# Patient Record
Sex: Female | Born: 1995 | Race: White | Hispanic: No | Marital: Single | Smoking: Never smoker
Health system: Southern US, Community
[De-identification: ages and names within clinical notes are randomized; demographics above are authoritative.]

---
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
## 2016-06-04 ENCOUNTER — Ambulatory Visit (INDEPENDENT_AMBULATORY_CARE_PROVIDER_SITE_OTHER): Payer: 59 | Admitting: Physician Assistant

## 2016-06-04 ENCOUNTER — Encounter: Payer: Self-pay | Admitting: Physician Assistant

## 2016-06-04 VITALS — BP 115/70 | HR 86 | Ht 65.0 in | Wt 119.0 lb

## 2016-06-04 DIAGNOSIS — Z309 Encounter for contraceptive management, unspecified: Secondary | ICD-10-CM

## 2016-06-04 DIAGNOSIS — Z3009 Encounter for other general counseling and advice on contraception: Secondary | ICD-10-CM | POA: Insufficient documentation

## 2016-06-04 DIAGNOSIS — N946 Dysmenorrhea, unspecified: Secondary | ICD-10-CM

## 2016-06-04 MED ORDER — NORETHINDRONE ACET-ETHINYL EST 1-20 MG-MCG PO TABS: 1.0000 | ORAL_TABLET | Freq: Every day | ORAL | 11 refills | Status: DC

## 2016-06-04 NOTE — Patient Instructions (Signed)

## 2016-06-04 NOTE — Progress Notes (Signed)
   Subjective:    Patient ID: Rhonda Huff, female    DOB: 10/27/1996, 20 y.o.   MRN: [id number]  HPI Pt is a 20 yo female who presents to the clinic to establish care.   .. Active Ambulatory Problems    Diagnosis Date Noted  . ANKLE SPRAIN, LEFT 04/02/2010  . ANKLE INJURY, LEFT 04/02/2010  . Birth control counseling 06/04/2016  . Dysmenorrhea 06/04/2016   Resolved Ambulatory Problems    Diagnosis Date Noted  . No Resolved Ambulatory Problems   No Additional Past Medical History   .Marland Kitchen Family History  Problem Relation Age of Onset  . Hypertension Father   . Cancer Maternal Aunt     breast  . Diabetes Maternal Grandmother   . Diabetes Maternal Grandfather    .Marland Kitchen Social History   Social History  . Marital status: Single    Spouse name: N/A  . Number of children: N/A  . Years of education: N/A   Occupational History  . Not on file.   Social History Main Topics  . Smoking status: Never Smoker  . Smokeless tobacco: Never Used  . Alcohol use No  . Drug use: No  . Sexual activity: Not Currently   Other Topics Concern  . Not on file   Social History Narrative  . No narrative on file   Pt would like to start birth control. She has never been on anything. She is not sexually active. She has always had painful periods with 3-4 days of cramping.    Review of Systems  All other systems reviewed and are negative.      Objective:   Physical Exam  Constitutional: She is oriented to person, place, and time. She appears well-developed and well-nourished.  HENT:  Head: Normocephalic and atraumatic.  Cardiovascular: Normal rate, regular rhythm and normal heart sounds.   Pulmonary/Chest: Effort normal and breath sounds normal.  Neurological: She is alert and oriented to person, place, and time.  Psychiatric: She has a normal mood and affect. Her behavior is normal.          Assessment & Plan:  Dysmenorrhea/Birth Control Counseling- started OCP. Discussed side  effects and risk factors. Discussed how to take and what to do if miss a tablet. Pt aware does not protect against STI's and to wear condoms. Follow up in 1 year or as needed.

## 2017-01-31 ENCOUNTER — Other Ambulatory Visit: Payer: Self-pay | Admitting: Physician Assistant

## 2017-02-21 ENCOUNTER — Other Ambulatory Visit: Payer: Self-pay | Admitting: Physician Assistant

## 2017-04-07 ENCOUNTER — Other Ambulatory Visit: Payer: Self-pay | Admitting: Physician Assistant

## 2017-07-22 ENCOUNTER — Ambulatory Visit (INDEPENDENT_AMBULATORY_CARE_PROVIDER_SITE_OTHER): Payer: 59 | Admitting: Physician Assistant

## 2017-07-22 ENCOUNTER — Encounter: Payer: Self-pay | Admitting: Physician Assistant

## 2017-07-22 VITALS — BP 113/70 | HR 70 | Wt 129.0 lb

## 2017-07-22 DIAGNOSIS — N946 Dysmenorrhea, unspecified: Secondary | ICD-10-CM | POA: Diagnosis not present

## 2017-07-22 MED ORDER — NORETHINDRONE ACET-ETHINYL EST 1-20 MG-MCG PO TABS: 1.0000 | ORAL_TABLET | Freq: Every day | ORAL | 4 refills | Status: DC

## 2017-07-22 NOTE — Progress Notes (Signed)
   Subjective:    Patient ID: Rhonda Huff, female    DOB: 02-Nov-1995, 21 y.o.   MRN: [id number]  HPI  Pt is a 21 yo female who presents to the clinic for OCP refill. She takes for dysmenorrhea. She has had a lot of improvement with cramps. She is not sexually active. She denies any side effects.   .. Active Ambulatory Problems    Diagnosis Date Noted  . ANKLE SPRAIN, LEFT 04/02/2010  . ANKLE INJURY, LEFT 04/02/2010  . Birth control counseling 06/04/2016  . Dysmenorrhea 06/04/2016   Resolved Ambulatory Problems    Diagnosis Date Noted  . No Resolved Ambulatory Problems   No Additional Past Medical History      Review of Systems  All other systems reviewed and are negative.      Objective:   Physical Exam  Constitutional: She is oriented to person, place, and time. She appears well-developed and well-nourished.  HENT:  Head: Normocephalic and atraumatic.  Neck: Normal range of motion. Neck supple. No thyromegaly present.  Cardiovascular: Normal rate, regular rhythm and normal heart sounds.   Pulmonary/Chest: Effort normal and breath sounds normal.  Neurological: She is alert and oriented to person, place, and time.  Psychiatric: She has a normal mood and affect. Her behavior is normal.          Assessment & Plan:  Marland KitchenMarland KitchenAyano was seen today for refill on birth control.  Diagnoses and all orders for this visit:  Dysmenorrhea -     norethindrone-ethinyl estradiol (MICROGESTIN) 1-20 MG-MCG tablet; Take 1 tablet by mouth daily.   Refilled for one year.  BP looks great.  Pt is not sexually active but aware OCP does not protect against STD and to also use a condom.  Pt declined flu shot.  Pap not indicated until 22.

## 2017-07-28 ENCOUNTER — Telehealth: Payer: Self-pay | Admitting: Physician Assistant

## 2017-07-28 ENCOUNTER — Ambulatory Visit (INDEPENDENT_AMBULATORY_CARE_PROVIDER_SITE_OTHER): Payer: 59 | Admitting: Physician Assistant

## 2017-07-28 ENCOUNTER — Encounter: Payer: Self-pay | Admitting: Physician Assistant

## 2017-07-28 VITALS — BP 120/68 | HR 78 | Temp 98.0°F | Wt 128.0 lb

## 2017-07-28 DIAGNOSIS — J029 Acute pharyngitis, unspecified: Secondary | ICD-10-CM

## 2017-07-28 DIAGNOSIS — J069 Acute upper respiratory infection, unspecified: Secondary | ICD-10-CM

## 2017-07-28 LAB — POCT RAPID STREP A (OFFICE): Rapid Strep A Screen: NEGATIVE

## 2017-07-28 MED ORDER — BENZONATATE 100 MG PO CAPS: 100.0000 mg | ORAL_CAPSULE | Freq: Three times a day (TID) | ORAL | 0 refills | Status: AC | PRN

## 2017-07-28 MED ORDER — LIDOCAINE VISCOUS 2 % MT SOLN: 10.0000 mL | OROMUCOSAL | 0 refills | Status: DC | PRN

## 2017-07-28 MED ORDER — IPRATROPIUM BROMIDE 0.06 % NA SOLN: 1.0000 | Freq: Four times a day (QID) | NASAL | 0 refills | Status: DC | PRN

## 2017-07-28 NOTE — Telephone Encounter (Signed)
Patient called was just seen today and would like to have a doctor's note faxed to [phone]. Thanks

## 2017-07-28 NOTE — Patient Instructions (Addendum)
- Alternate Tylenol and Advil for throat pain - Advil (Ibuprofen) 600 mg every 6 hours - Tyleno (acetaminophen)l 1000 mg every 8 hours - Gargle and spit viscous lidocaine every 3 hours as needed for throat pain - Cepacol throat lozenges - Warm tea with honey   Upper Respiratory Infection, Adult Most upper respiratory infections (URIs) are a viral infection of the air passages leading to the lungs. A URI affects the nose, throat, and upper air passages. The most common type of URI is nasopharyngitis and is typically referred to as "the common cold." URIs run their course and usually go away on their own. Most of the time, a URI does not require medical attention, but sometimes a bacterial infection in the upper airways can follow a viral infection. This is called a secondary infection. Sinus and middle ear infections are common types of secondary upper respiratory infections. Bacterial pneumonia can also complicate a URI. A URI can worsen asthma and chronic obstructive pulmonary disease (COPD). Sometimes, these complications can require emergency medical care and may be life threatening. What are the causes? Almost all URIs are caused by viruses. A virus is a type of germ and can spread from one person to another. What increases the risk? You may be at risk for a URI if:  You smoke.  You have chronic heart or lung disease.  You have a weakened defense (immune) system.  You are very young or very old.  You have nasal allergies or asthma.  You work in crowded or poorly ventilated areas.  You work in health care facilities or schools.  What are the signs or symptoms? Symptoms typically develop 2-3 days after you come in contact with a cold virus. Most viral URIs last 7-10 days. However, viral URIs from the influenza virus (flu virus) can last 14-18 days and are typically more severe. Symptoms may include:  Runny or stuffy (congested) nose.  Sneezing.  Cough.  Sore  throat.  Headache.  Fatigue.  Fever.  Loss of appetite.  Pain in your forehead, behind your eyes, and over your cheekbones (sinus pain).  Muscle aches.  How is this diagnosed? Your health care provider may diagnose a URI by:  Physical exam.  Tests to check that your symptoms are not due to another condition such as: ? Strep throat. ? Sinusitis. ? Pneumonia. ? Asthma.  How is this treated? A URI goes away on its own with time. It cannot be cured with medicines, but medicines may be prescribed or recommended to relieve symptoms. Medicines may help:  Reduce your fever.  Reduce your cough.  Relieve nasal congestion.  Follow these instructions at home:  Take medicines only as directed by your health care provider.  Gargle warm saltwater or take cough drops to comfort your throat as directed by your health care provider.  Use a warm mist humidifier or inhale steam from a shower to increase air moisture. This may make it easier to breathe.  Drink enough fluid to keep your urine clear or pale yellow.  Eat soups and other clear broths and maintain good nutrition.  Rest as needed.  Return to work when your temperature has returned to normal or as your health care provider advises. You may need to stay home longer to avoid infecting others. You can also use a face mask and careful hand washing to prevent spread of the virus.  Increase the usage of your inhaler if you have asthma.  Do not use any tobacco products, including  cigarettes, chewing tobacco, or electronic cigarettes. If you need help quitting, ask your health care provider. How is this prevented? The best way to protect yourself from getting a cold is to practice good hygiene.  Avoid oral or hand contact with people with cold symptoms.  Wash your hands often if contact occurs.  There is no clear evidence that vitamin C, vitamin E, echinacea, or exercise reduces the chance of developing a cold. However, it is  always recommended to get plenty of rest, exercise, and practice good nutrition. Contact a health care provider if:  You are getting worse rather than better.  Your symptoms are not controlled by medicine.  You have chills.  You have worsening shortness of breath.  You have brown or red mucus.  You have yellow or brown nasal discharge.  You have pain in your face, especially when you bend forward.  You have a fever.  You have swollen neck glands.  You have pain while swallowing.  You have white areas in the back of your throat. Get help right away if:  You have severe or persistent: ? Headache. ? Ear pain. ? Sinus pain. ? Chest pain.  You have chronic lung disease and any of the following: ? Wheezing. ? Prolonged cough. ? Coughing up blood. ? A change in your usual mucus.  You have a stiff neck.  You have changes in your: ? Vision. ? Hearing. ? Thinking. ? Mood. This information is not intended to replace advice given to you by your health care provider. Make sure you discuss any questions you have with your health care provider. Document Released: 04/13/2001 Document Revised: 06/20/2016 Document Reviewed: 01/23/2014 Elsevier Interactive Patient Education  2017 [employer].

## 2017-07-28 NOTE — Telephone Encounter (Signed)
Letter faxed -EH/RMA  

## 2017-07-28 NOTE — Progress Notes (Signed)
HPI:                                                                Rhonda Huff is a 21 y.o. female who presents to [facility]: Primary Care Sports Medicine today for sore throat  Sore Throat   This is a new problem. The current episode started yesterday. The problem has been unchanged. The pain is worse on the left side. There has been no fever. The pain is moderate. Associated symptoms include coughing (productive), ear pain and headaches. Pertinent negatives include no drooling, stridor or trouble swallowing. Associated symptoms comments: + fatigue + myalgias. Treatments tried: antihistamine.     No past medical history on file. No past surgical history on file. Social History  Substance Use Topics  . Smoking status: Never Smoker  . Smokeless tobacco: Never Used  . Alcohol use No   family history includes Cancer in her maternal aunt; Diabetes in her maternal grandfather and maternal grandmother; Hypertension in her father.  ROS: negative except as noted in the HPI  Medications: Current Outpatient Prescriptions  Medication Sig Dispense Refill  . norethindrone-ethinyl estradiol (MICROGESTIN) 1-20 MG-MCG tablet Take 1 tablet by mouth daily. 63 tablet 4   No current facility-administered medications for this visit.    Allergies  Allergen Reactions  . Latex        Objective:  BP 120/68   Pulse 78   Temp 98 F (36.7 C) (Oral)   Wt 128 lb (58.1 kg)   BMI 21.30 kg/m  Gen: well-groomed, cooperative, not ill-appearing, no distress HEENT: normal conjunctiva, TM's clear bilaterally, nasal mucosa edematous, oropharynx with erythema, tonsils are swollen and friable, no exudates, uvula midline, moist mucus membranes, no frontal or maxillary sinus tenderness, neck supple, trachea midline Pulm: Normal work of breathing, normal phonation, clear to auscultation bilaterally, no wheezes, rales or rhonchi CV: Normal rate, regular rhythm, s1 and s2 distinct, no  murmurs, clicks or rubs  Neuro: alert and oriented x 3, no tremor MSK: extremities atraumatic, normal gait and station Lymph: there is tonsillar adenopathy Skin: warm, dry, intact; no rashes on exposed skin, no cyanosis   No results found for this or any previous visit (from the past 72 hour(s)). No results found.  Assessment and Plan: 21 y.o. female with   1. Acute upper respiratory infection - POCT rapid strep A negative - symptomatic management - lidocaine (XYLOCAINE) 2 % solution; Use as directed 10 mLs in the mouth or throat every 3 (three) hours as needed for mouth pain.  Dispense: 100 mL; Refill: 0 - ipratropium (ATROVENT) 0.06 % nasal spray; Place 1 spray into both nostrils 4 (four) times daily as needed.  Dispense: 15 mL; Refill: 0 - benzonatate (TESSALON) 100 MG capsule; Take 1 capsule (100 mg total) by mouth 3 (three) times daily as needed for cough.  Dispense: 40 capsule; Refill: 0   Patient education and anticipatory guidance given Patient agrees with treatment plan Follow-up as needed if symptoms worsen or fail to improve  Levonne Hubert PA-C

## 2017-07-29 ENCOUNTER — Ambulatory Visit: Payer: 59 | Admitting: Physician Assistant

## 2018-06-17 ENCOUNTER — Other Ambulatory Visit: Payer: Self-pay | Admitting: Physician Assistant

## 2018-06-17 DIAGNOSIS — N946 Dysmenorrhea, unspecified: Secondary | ICD-10-CM

## 2018-07-04 ENCOUNTER — Ambulatory Visit (INDEPENDENT_AMBULATORY_CARE_PROVIDER_SITE_OTHER): Payer: 59 | Admitting: Physician Assistant

## 2018-07-04 ENCOUNTER — Encounter: Payer: Self-pay | Admitting: Physician Assistant

## 2018-07-04 DIAGNOSIS — N946 Dysmenorrhea, unspecified: Secondary | ICD-10-CM | POA: Diagnosis not present

## 2018-07-04 MED ORDER — NORETHINDRONE ACET-ETHINYL EST 1-20 MG-MCG PO TABS: 1.0000 | ORAL_TABLET | Freq: Every day | ORAL | 3 refills | Status: DC

## 2018-07-04 NOTE — Progress Notes (Signed)
   Subjective:    Patient ID: Rhonda Huff, female    DOB: Nov 22, 1995, 22 y.o.   MRN: [id number]  HPI  Patient is a 22 year old female who presents to the clinic for refill on birth control.  She is doing great on current regimen.  She is sexually active with one partner.  She is not using any condoms for STD protection.  She has no concerns.  .. Active Ambulatory Problems    Diagnosis Date Noted  . ANKLE SPRAIN, LEFT 04/02/2010  . ANKLE INJURY, LEFT 04/02/2010  . Birth control counseling 06/04/2016  . Dysmenorrhea 06/04/2016   Resolved Ambulatory Problems    Diagnosis Date Noted  . No Resolved Ambulatory Problems   No Additional Past Medical History      Review of Systems  All other systems reviewed and are negative.      Objective:   Physical Exam  Constitutional: She is oriented to person, place, and time. She appears well-developed and well-nourished.  HENT:  Head: Normocephalic and atraumatic.  Cardiovascular: Normal rate and regular rhythm.  Pulmonary/Chest: Effort normal and breath sounds normal.  Neurological: She is alert and oriented to person, place, and time.  Psychiatric: She has a normal mood and affect. Her behavior is normal.          Assessment & Plan:  Marland KitchenMarland KitchenDiagnoses and all orders for this visit:  Dysmenorrhea -     norethindrone-ethinyl estradiol (MICROGESTIN) 1-20 MG-MCG tablet; Take 1 tablet by mouth daily.   Refill birth control for 1 year.  Patient declined any STD testing.  She is aware that Pap smear will be indicated when she turns 22.  Discussed that birth control does not protect you against STDs.  Pt declined flu shot, tdap, HPV vaccine. Pt aware of risks.

## 2019-07-24 ENCOUNTER — Encounter: Payer: Self-pay | Admitting: Physician Assistant

## 2019-07-24 ENCOUNTER — Ambulatory Visit (INDEPENDENT_AMBULATORY_CARE_PROVIDER_SITE_OTHER): Payer: 59 | Admitting: Physician Assistant

## 2019-07-24 ENCOUNTER — Other Ambulatory Visit: Payer: Self-pay

## 2019-07-24 VITALS — BP 125/80 | HR 92 | Ht 65.0 in | Wt 144.0 lb

## 2019-07-24 DIAGNOSIS — F419 Anxiety disorder, unspecified: Secondary | ICD-10-CM | POA: Diagnosis not present

## 2019-07-24 DIAGNOSIS — Z113 Encounter for screening for infections with a predominantly sexual mode of transmission: Secondary | ICD-10-CM | POA: Diagnosis not present

## 2019-07-24 DIAGNOSIS — Z3041 Encounter for surveillance of contraceptive pills: Secondary | ICD-10-CM | POA: Diagnosis not present

## 2019-07-24 MED ORDER — HYDROXYZINE HCL 10 MG PO TABS: 10.0000 mg | ORAL_TABLET | Freq: Three times a day (TID) | ORAL | 1 refills | Status: DC | PRN

## 2019-07-24 MED ORDER — ETONOGESTREL-ETHINYL ESTRADIOL 0.12-0.015 MG/24HR VA RING: VAGINAL_RING | VAGINAL | 12 refills | Status: DC
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
## 2019-07-24 NOTE — Patient Instructions (Signed)
Ethinyl Estradiol; Etonogestrel vaginal ring What is this medicine? ETHINYL ESTRADIOL; ETONOGESTREL (ETH in il es tra DYE ole; et oh noe JES trel) vaginal ring is a flexible, vaginal ring used as a contraceptive (birth control method). This medicine combines 2 types of female hormones, an estrogen and a progestin. This ring is used to prevent ovulation and pregnancy. Each ring is effective for 1 month. This medicine may be used for other purposes; ask your health care provider or pharmacist if you have questions. COMMON BRAND NAME(S): EluRyng, NuvaRing What should I tell my health care provider before I take this medicine? They need to know if you have any of these conditions:  abnormal vaginal bleeding  blood vessel disease or blood clots  breast, cervical, endometrial, ovarian, liver, or uterine cancer  diabetes  gallbladder disease  having surgery  heart disease or recent heart attack  high blood pressure  high cholesterol or triglycerides  history of irregular heartbeat or heart valve problems  kidney disease  liver disease  migraine headaches  protein C deficiency  protein S deficiency  recently had a baby, miscarriage, or abortion  stroke  systemic lupus erythematosus (SLE)  tobacco smoker  your age is more than 22 years old  an unusual or allergic reaction to estrogens, progestins, other medicines, foods, dyes, or preservatives  pregnant or trying to get pregnant  breast-feeding How should I use this medicine? Insert the ring into your vagina as directed. Follow the directions on the prescription label. The ring will remain place for 3 weeks and is then removed for a 1-week break. A new ring is inserted 1 week after the last ring was removed, on the same day of the week. Check often to make sure the ring is still in place. If the ring was out of the vagina for an unknown amount of time, you may not be protected from pregnancy. Perform a pregnancy test and  call your doctor. Do not use more often than directed. A patient package insert for the product will be given with each prescription and refill. Read this sheet carefully each time. The sheet may change frequently. Contact your pediatrician regarding the use of this medicine in children. Special care may be needed. Overdosage: If you think you have taken too much of this medicine contact a poison control center or emergency room at once. NOTE: This medicine is only for you. Do not share this medicine with others. What if I miss a dose? You will need to use the ring exactly as directed. It is very important to follow the schedule every cycle. If you do not use the ring as directed, you may not be protected from pregnancy. If the ring should slip out, is lost, or if you leave it in longer or shorter than you should, contact your health care professional for advice. What may interact with this medicine? Do not take this medicine with the following medications:  dasabuvir; ombitasvir; paritaprevir; ritonavir  ombitasvir; paritaprevir; ritonavir  vaginal lubricants or other vaginal products that are oil-based or silicone-based This medicine may also interact with the following medications:  acetaminophen  antibiotics or medicines for infections, especially rifampin, rifabutin, rifapentine, and griseofulvin, and possibly penicillins or tetracyclines  aprepitant or fosaprepitant  armodafinil  ascorbic acid (vitamin C)  barbiturate medicines, such as phenobarbital or primidone  bosentan  certain antiviral medicines for hepatitis, HIV or AIDS  certain medicines for cancer treatment  certain medicines for seizures like carbamazepine, clobazam, felbamate, lamotrigine, oxcarbazepine, phenytoin,   rufinamide, topiramate  certain medicines for treating high cholesterol  cyclosporine  dantrolene  elagolix  flibanserin  grapefruit juice  lesinurad  medicines for diabetes  medicines  to treat fungal infections, such as griseofulvin, miconazole, fluconazole, ketoconazole, itraconazole, posaconazole or voriconazole  mifepristone  mitotane  modafinil  morphine  mycophenolate  St. John's wort  tamoxifen  temazepam  theophylline or aminophylline  thyroid hormones  tizanidine  tranexamic acid  ulipristal  warfarin This list may not describe all possible interactions. Give your health care provider a list of all the medicines, herbs, non-prescription drugs, or dietary supplements you use. Also tell them if you smoke, drink alcohol, or use illegal drugs. Some items may interact with your medicine. What should I watch for while using this medicine? Visit your doctor or health care professional for regular checks on your progress. You will need a regular breast and pelvic exam and Pap smear while on this medicine. Check with your doctor or health care professional to see if you need an additional method of contraception during the first cycle that you use this ring. Female condoms (made with natural rubber latex, polyisoprene, and polyurethane) and spermicides may be used. Do not use a diaphragm, cervical cap, or a female condom, as the ring can interfere with these birth control methods and their proper placement. If you have any reason to think you are pregnant, stop using this medicine right away and contact your doctor or health care professional. If you are using this medicine for hormone related problems, it may take several cycles of use to see improvement in your condition. Smoking increases the risk of getting a blood clot or having a stroke while you are using hormonal birth control, especially if you are more than 22 years old. You are strongly advised not to smoke. Some women are prone to getting dark patches on the skin of the face (cholasma). Your risk of getting chloasma with this medicine is higher if you had chloasma during a pregnancy. Keep out of the  sun. If you cannot avoid being in the sun, wear protective clothing and use sunscreen. Do not use sun lamps or tanning beds/booths. This medicine can make your body retain fluid, making your fingers, hands, or ankles swell. Your blood pressure can go up. Contact your doctor or health care professional if you feel you are retaining fluid. If you are going to have elective surgery, you may need to stop using this medicine before the surgery. Consult your health care professional for advice. This medicine does not protect you against HIV infection (AIDS) or any other sexually transmitted diseases. What side effects may I notice from receiving this medicine? Side effects that you should report to your doctor or health care professional as soon as possible:  allergic reactions such as skin rash or itching, hives, swelling of the lips, mouth, tongue, or throat  depression  high blood pressure  migraines or severe, sudden headaches  signs and symptoms of a blood clot such as breathing problems; changes in vision; chest pain; severe, sudden headache; pain, swelling, warmth in the leg; trouble speaking; sudden numbness or weakness of the face, arm or leg  signs and symptoms of infection like fever or chills with dizziness and a sunburn-like rash, or pain or trouble passing urine  stomach pain  symptoms of vaginal infection like itching, irritation or unusual discharge  yellowing of the eyes or skin Side effects that usually do not require medical attention (report these to your doctor   or health care professional if they continue or are bothersome):  acne  breast pain, tenderness  irregular vaginal bleeding or spotting, particularly during the first month of use  mild headache  nausea  painful periods  vomiting This list may not describe all possible side effects. Call your doctor for medical advice about side effects. You may report side effects to FDA at 1-800-FDA-1088. Where should I  keep my medicine? Keep out of the reach of children. Store unopened rings in the original foil pouch at room temperature between 20 and 25 degrees C (68 and 77 degrees F) for up to 4 months. Protect from light. Do not store above 30 degrees C (86 degrees F). Throw away any unused medicine after the expiration date. A ring may only be used for 1 cycle (1 month). After the 3-week cycle, a used ring is removed and should be placed in the re-closable foil pouch and discarded in the trash out of reach of children and pets. Do NOT flush down the toilet. NOTE: This sheet is a summary. It may not cover all possible information. If you have questions about this medicine, talk to your doctor, pharmacist, or health care provider.  2020 Elsevier/Gold Standard (2017-06-17 14:41:10)  

## 2019-07-24 NOTE — Progress Notes (Signed)
l °

## 2019-07-25 LAB — C. TRACHOMATIS/N. GONORRHOEAE RNA
C. trachomatis RNA, TMA: NOT DETECTED
N. gonorrhoeae RNA, TMA: NOT DETECTED

## 2019-07-25 NOTE — Progress Notes (Signed)
No GC/Chlamydia

## 2019-07-26 ENCOUNTER — Encounter: Payer: Self-pay | Admitting: Physician Assistant

## 2019-09-23 ENCOUNTER — Encounter: Payer: Self-pay | Admitting: Physician Assistant

## 2020-01-31 ENCOUNTER — Encounter: Payer: Self-pay | Admitting: Physician Assistant

## 2020-04-08 ENCOUNTER — Other Ambulatory Visit: Payer: Self-pay

## 2020-04-08 ENCOUNTER — Encounter: Payer: Self-pay | Admitting: Physician Assistant

## 2020-04-08 DIAGNOSIS — Z3041 Encounter for surveillance of contraceptive pills: Secondary | ICD-10-CM

## 2020-04-08 DIAGNOSIS — F419 Anxiety disorder, unspecified: Secondary | ICD-10-CM

## 2020-04-08 MED ORDER — ETONOGESTREL-ETHINYL ESTRADIOL 0.12-0.015 MG/24HR VA RING: VAGINAL_RING | VAGINAL | 1 refills | Status: DC

## 2020-04-08 MED ORDER — HYDROXYZINE HCL 10 MG PO TABS: 10.0000 mg | ORAL_TABLET | Freq: Three times a day (TID) | ORAL | 0 refills | Status: DC | PRN

## 2020-04-08 NOTE — Telephone Encounter (Signed)
Pt called requesting new RXs d/t change in pharmacy.  She also requests 90 day supply of these.  RXs sent to new pharmacy.  Tiajuana Amass, CMA

## 2020-07-21 ENCOUNTER — Ambulatory Visit: Payer: Self-pay

## 2020-07-26 ENCOUNTER — Encounter: Payer: Self-pay | Admitting: Physician Assistant

## 2021-10-08 ENCOUNTER — Other Ambulatory Visit: Payer: Self-pay

## 2021-10-08 ENCOUNTER — Ambulatory Visit: Admission: EM | Admit: 2021-10-08 | Discharge: 2021-10-08 | Discharge status: Absent without leave | Payer: 59

## 2023-03-04 ENCOUNTER — Encounter: Payer: Self-pay | Admitting: Emergency Medicine

## 2023-03-04 ENCOUNTER — Ambulatory Visit
Admission: EM | Admit: 2023-03-04 | Discharge: 2023-03-04 | Disposition: A | Discharge status: Home / Self Care | Payer: BC Managed Care – PPO | Attending: Physician Assistant | Admitting: Physician Assistant

## 2023-03-04 DIAGNOSIS — J029 Acute pharyngitis, unspecified: Secondary | ICD-10-CM

## 2023-03-04 DIAGNOSIS — Z20818 Contact with and (suspected) exposure to other bacterial communicable diseases: Secondary | ICD-10-CM

## 2023-03-04 LAB — POCT RAPID STREP A (OFFICE): Rapid Strep A Screen: NEGATIVE

## 2023-03-04 MED ORDER — AMOXICILLIN 500 MG PO CAPS: 500.0000 mg | ORAL_CAPSULE | Freq: Two times a day (BID) | ORAL | 0 refills | Status: DC

## 2023-03-04 NOTE — ED Provider Notes (Signed)
[facility]    CSN: [id number] Arrival date & time: 03/04/23  0806      History   Chief Complaint No chief complaint on file.   HPI Rhonda Huff is a 27 y.o. female.   Patient presents today with a 3-day history of sore throat.  She does report some mild congestion symptoms but attributed this to allergies.  Reports that she has been taking Tylenol, ibuprofen, multisymptom medication with minimal improvement of symptoms.  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  Denies any recent antibiotics.  She reports she is eating and drinking normally.  Denies any associated dysphagia, swelling of her throat, shortness of breath, muffled voice, fever.    History reviewed. No pertinent past medical history.  Patient Active Problem List   Diagnosis Date Noted   Anxiety 07/24/2019   Birth control counseling 06/04/2016   Dysmenorrhea 06/04/2016   ANKLE SPRAIN, LEFT 04/02/2010   ANKLE INJURY, LEFT 04/02/2010    History reviewed. No pertinent surgical history.  OB History   No obstetric history on file.      Home Medications    Prior to Admission medications   Medication Sig Start Date End Date Taking? Authorizing Provider  amoxicillin (AMOXIL) 500 MG capsule Take 1 capsule (500 mg total) by mouth 2 (two) times daily. 03/04/23  Yes Nara Paternoster, Noberto Retort, PA-C    Family History Family History  Problem Relation Age of Onset   Hypertension Father    Cancer Maternal Aunt        breast   Diabetes Maternal Grandmother    Diabetes Maternal Grandfather     Social History Social History   Tobacco Use   Smoking status: Never   Smokeless tobacco: Never  Substance Use Topics   Alcohol use: No   Drug use: No     Allergies   Latex   Review of Systems Review of Systems  Constitutional:  Positive  for activity change. Negative for appetite change, fatigue and fever.  HENT:  Positive for sore throat. Negative for congestion, sinus pressure and sneezing.   Respiratory:  Negative for cough and shortness of breath.   Cardiovascular:  Negative for chest pain.  Gastrointestinal:  Negative for abdominal pain, diarrhea, nausea and vomiting.     Physical Exam Triage Vital Signs ED Triage Vitals  Enc Vitals Group     BP 03/04/23 0812 112/73     Pulse Rate 03/04/23 0812 83     Resp 03/04/23 0812 18     Temp 03/04/23 0812 98.1 F (36.7 C)     Temp Source 03/04/23 0812 Oral     SpO2 03/04/23 0812 96 %     Weight --      Height --      Head Circumference --      Peak Flow --      Pain Score 03/04/23 0813 3     Pain Loc --      Pain Edu? --      Excl. in GC? --    No data found.  Updated Vital Signs BP 112/73 (BP Location: Right Arm)   Pulse 83   Temp 98.1 F (36.7 C) (Oral)   Resp 18   LMP 03/01/2023 (Exact Date)   SpO2 96%   Visual Acuity Right Eye  Distance:   Left Eye Distance:   Bilateral Distance:    Right Eye Near:   Left Eye Near:    Bilateral Near:     Physical Exam Vitals reviewed.  Constitutional:      General: She is awake. She is not in acute distress.    Appearance: Normal appearance. She is well-developed. She is not ill-appearing.     Comments: Very pleasant female appears stated age in no acute distress sitting comfortably in exam room  HENT:     Head: Normocephalic and atraumatic.     Right Ear: Tympanic membrane, ear canal and external ear normal. Tympanic membrane is not erythematous or bulging.     Left Ear: Tympanic membrane, ear canal and external ear normal. Tympanic membrane is not erythematous or bulging.     Nose:     Right Sinus: No maxillary sinus tenderness or frontal sinus tenderness.     Left Sinus: No maxillary sinus tenderness or frontal sinus tenderness.     Mouth/Throat:     Pharynx: Uvula midline. Posterior oropharyngeal  erythema present. No oropharyngeal exudate.     Tonsils: No tonsillar exudate or tonsillar abscesses. 2+ on the right. 2+ on the left.  Cardiovascular:     Rate and Rhythm: Normal rate and regular rhythm.     Heart sounds: Normal heart sounds, S1 normal and S2 normal. No murmur heard. Pulmonary:     Effort: Pulmonary effort is normal.     Breath sounds: Normal breath sounds. No wheezing, rhonchi or rales.     Comments: Clear to auscultation bilaterally Psychiatric:        Behavior: Behavior is cooperative.      UC Treatments / Results  Labs (all labs ordered are listed, but only abnormal results are displayed) Labs Reviewed  POCT RAPID STREP A (OFFICE)    EKG   Radiology No results found.  Procedures Procedures (including critical care time)  Medications Ordered in UC Medications - No data to display  Initial Impression / Assessment and Plan / UC Course  I have reviewed the triage vital signs and the nursing notes.  Pertinent labs & imaging results that were available during my care of the patient were reviewed by me and considered in my medical decision making (see chart for details).     Patient is well-appearing, afebrile, nontoxic, nontachycardic.  Strep testing was negative in our clinic but she reports close exposure with positive strep test elsewhere.  Will treat with amoxicillin 500 mg twice daily for 10 days.  Discussed that she should dispose of her toothbrush a few days after starting medication.  Can use over-the-counter medication including Tylenol and ibuprofen for pain relief as well as gargling with warm salt water.  We did discuss that if her strep test was falsely positive and her symptoms are related to mono it is possible she developed a rash with amoxicillin.  She reports that she had mono in 2017 and does not believe that this could be the case.  If she develops any side effects from the medication or additional symptoms she is to return for  reevaluation.  Discussed that if she has worsening symptoms including chest pain, shortness of breath, muffled voice, high fever, dysphagia, she should be seen immediately.  Strict return precautions given.  Work excuse note provided.  Final Clinical Impressions(s) / UC Diagnoses   Final diagnoses:  Acute pharyngitis, unspecified etiology  Sore throat  Exposure to strep throat     Discharge Instructions  We are treating you for strep.  Take amoxicillin 500 mg twice daily for 10 days.  Throw away toothbrush a few days after starting medication to prevent reinfection.  Gargle with warm salt water and use Tylenol ibuprofen for pain.  It is possible that if you are actually positive for mono you could develop a rash.  If that happens please stop the medication to be seen immediately.  If you have any worsening symptoms including high fever, change in your voice, difficulty swallowing, shortness of breath you should be seen immediately.     ED Prescriptions     Medication Sig Dispense Auth. Provider   amoxicillin (AMOXIL) 500 MG capsule Take 1 capsule (500 mg total) by mouth 2 (two) times daily. 20 capsule Charne Mcbrien, Noberto Retort, PA-C      PDMP not reviewed this encounter.   Arnelle, Storti, PA-C 03/04/23 [phone]

## 2023-03-04 NOTE — ED Triage Notes (Signed)
Sore throat x 3 days.  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  Took a home strep test that showed positive.

## 2023-03-04 NOTE — Discharge Instructions (Signed)
We are treating you for strep.  Take amoxicillin 500 mg twice daily for 10 days.  Throw away toothbrush a few days after starting medication to prevent reinfection.  Gargle with warm salt water and use Tylenol ibuprofen for pain.  It is possible that if you are actually positive for mono you could develop a rash.  If that happens please stop the medication to be seen immediately.  If you have any worsening symptoms including high fever, change in your voice, difficulty swallowing, shortness of breath you should be seen immediately.

## 2023-08-09 ENCOUNTER — Ambulatory Visit: Payer: Self-pay

## 2023-10-10 ENCOUNTER — Ambulatory Visit: Payer: BC Managed Care – PPO | Admitting: Family Medicine

## 2023-10-28 ENCOUNTER — Ambulatory Visit: Payer: BC Managed Care – PPO | Admitting: Family Medicine

## 2023-10-28 ENCOUNTER — Other Ambulatory Visit (HOSPITAL_COMMUNITY)
Admission: RE | Admit: 2023-10-28 | Discharge: 2023-10-28 | Disposition: A | Discharge status: Home / Self Care | Payer: BC Managed Care – PPO | Source: Ambulatory Visit | Attending: Family Medicine | Admitting: Family Medicine

## 2023-10-28 ENCOUNTER — Encounter: Payer: Self-pay | Admitting: Family Medicine

## 2023-10-28 VITALS — BP 125/74 | HR 73 | Ht 65.0 in | Wt 150.2 lb

## 2023-10-28 DIAGNOSIS — R41840 Attention and concentration deficit: Secondary | ICD-10-CM | POA: Insufficient documentation

## 2023-10-28 DIAGNOSIS — Z124 Encounter for screening for malignant neoplasm of cervix: Secondary | ICD-10-CM | POA: Insufficient documentation

## 2023-10-28 DIAGNOSIS — F411 Generalized anxiety disorder: Secondary | ICD-10-CM | POA: Insufficient documentation

## 2023-10-28 MED ORDER — ESCITALOPRAM OXALATE 10 MG PO TABS: 10.0000 mg | ORAL_TABLET | Freq: Every day | ORAL | 2 refills | Status: DC

## 2023-10-28 NOTE — Assessment & Plan Note (Signed)
Pap smear done today. No adnexal masses or abnormalities seen on speculum exam

## 2023-10-28 NOTE — Assessment & Plan Note (Signed)
Have sent referral for ADHD testing

## 2023-10-28 NOTE — Assessment & Plan Note (Signed)
Have gone ahead and started pt on lexapro 10mg  discussed side effects and how to take medication - will follow up in 4 weeks via video visit to discuss efficacy

## 2023-10-28 NOTE — Progress Notes (Signed)
New patient visit   Patient: Rhonda Huff   DOB: 02-May-1996   27 y.o. Female  MRN: [id number] Visit Date: 10/28/2023  Today's healthcare provider: Charlton Amor, DO   Chief Complaint  Patient presents with   New Patient (Initial Visit)    Establish Care    SUBJECTIVE    Chief Complaint  Patient presents with   New Patient (Initial Visit)    Establish Care   HPI HPI     New Patient (Initial Visit)    Additional comments: Establish Care      Last edited by Roselyn Reef, CMA on 10/28/2023  9:25 AM.      Pt presents to establish care. Has a pmh of anxiety and is wanting to discuss getting back on medication. Also has concerns of focus issues. 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   Review of Systems  Constitutional:  Negative for activity change, fatigue and fever.  Respiratory:  Negative for cough and shortness of breath.   Cardiovascular:  Negative for chest pain.  Gastrointestinal:  Negative for abdominal pain.  Genitourinary:  Negative for difficulty urinating.       Current Meds  Medication Sig   escitalopram (LEXAPRO) 10 MG tablet Take 1 tablet (10 mg total) by mouth daily.   METRONIDAZOLE, TOPICAL, 0.75 % LOTN SMARTSIG:Strip(s) Topical Daily   [DISCONTINUED] amoxicillin (AMOXIL) 500 MG capsule Take 1 capsule (500 mg total) by mouth 2 (two) times daily.   [DISCONTINUED] escitalopram (LEXAPRO) 10 MG tablet Take 1 tablet (10 mg total) by mouth daily.    OBJECTIVE    BP 125/74 (BP Location: Left Arm, Patient Position: Sitting, Cuff Size: Normal)   Pulse 73   Ht 5\' 5"  (1.651 m)   Wt 150 lb 4 oz (68.2 kg)   SpO2 100%   BMI 25.00 kg/m   Physical Exam Vitals and nursing note reviewed.  Constitutional:      General: She is not in acute distress.    Appearance: Normal appearance.  HENT:     Head: Normocephalic and atraumatic.     Right Ear: External ear normal.     Left Ear: External ear normal.     Nose: Nose  normal.  Eyes:     Conjunctiva/sclera: Conjunctivae normal.  Cardiovascular:     Rate and Rhythm: Normal rate and regular rhythm.  Pulmonary:     Effort: Pulmonary effort is normal.     Breath sounds: Normal breath sounds.  Genitourinary:    Comments: Chaperone present on exam. No adnexal masses seen. No polyps or abnormalities identified Neurological:     General: No focal deficit present.     Mental Status: She is alert and oriented to person, place, and time.  Psychiatric:        Mood and Affect: Mood normal.        Behavior: Behavior normal.        Thought Content: Thought content normal.        Judgment: Judgment normal.        ASSESSMENT & PLAN    Problem List Items Addressed This Visit       Other   GAD (generalized anxiety disorder) - Primary   Have gone ahead and started pt on lexapro 10mg  discussed side effects and how to take medication - will follow up in 4 weeks via video visit to discuss efficacy      Relevant Medications   escitalopram (LEXAPRO)  10 MG tablet   Concentration deficit   Have sent referral for ADHD testing      Relevant Orders   Ambulatory referral to Psychology   Screening for cervical cancer   Pap smear done today. No adnexal masses or abnormalities seen on speculum exam      Relevant Orders   Cytology - PAP    Return in about 4 weeks (around 11/25/2023).      Meds ordered this encounter  Medications   DISCONTD: escitalopram (LEXAPRO) 10 MG tablet    Sig: Take 1 tablet (10 mg total) by mouth daily.    Dispense:  30 tablet    Refill:  2   escitalopram (LEXAPRO) 10 MG tablet    Sig: Take 1 tablet (10 mg total) by mouth daily.    Dispense:  30 tablet    Refill:  2    Orders Placed This Encounter  Procedures   Ambulatory referral to Psychology    Referral Priority:   Routine    Referral Type:   Psychiatric    Referral Reason:   Specialty Services Required    Requested Specialty:   Psychology    Number of Visits Requested:    1     Charlton Amor, DO  [facility] at [facility] [phone] (phone) [phone] (fax)  [facility]

## 2023-10-31 LAB — CYTOLOGY - PAP: Diagnosis: NEGATIVE

## 2024-01-26 ENCOUNTER — Other Ambulatory Visit: Payer: Self-pay | Admitting: Family Medicine

## 2024-01-26 DIAGNOSIS — F411 Generalized anxiety disorder: Secondary | ICD-10-CM

## 2024-03-08 ENCOUNTER — Encounter: Payer: Self-pay | Admitting: Family Medicine

## 2024-08-09 ENCOUNTER — Encounter

## 2024-08-09 ENCOUNTER — Telehealth

## 2024-08-09 DIAGNOSIS — B9689 Other specified bacterial agents as the cause of diseases classified elsewhere: Secondary | ICD-10-CM | POA: Diagnosis not present

## 2024-08-09 DIAGNOSIS — N760 Acute vaginitis: Secondary | ICD-10-CM

## 2024-08-10 MED ORDER — METRONIDAZOLE 500 MG PO TABS: 500.0000 mg | ORAL_TABLET | Freq: Two times a day (BID) | ORAL | 0 refills | Status: AC

## 2024-08-10 NOTE — Progress Notes (Signed)
 We are sorry that you are not feeling well. Here is how we plan to help! Based on what you shared with me it looks like you: May have a vaginosis due to bacteria  Vaginosis is an inflammation of the vagina that can result in discharge, itching and pain. The cause is usually a change in the normal balance of vaginal bacteria or an infection. Vaginosis can also result from reduced estrogen levels after menopause.  The most common causes of vaginosis are:   Bacterial vaginosis which results from an overgrowth of one on several organisms that are normally present in your vagina.   Yeast infections which are caused by a naturally occurring fungus called candida.   Vaginal atrophy (atrophic vaginosis) which results from the thinning of the vagina from reduced estrogen levels after menopause.   Trichomoniasis which is caused by a parasite and is commonly transmitted by sexual intercourse.  Factors that increase your risk of developing vaginosis include: Medications, such as antibiotics and steroids Uncontrolled diabetes Use of hygiene products such as bubble bath, vaginal spray or vaginal deodorant Douching Wearing damp or tight-fitting clothing Using an intrauterine device (IUD) for birth control Hormonal changes, such as those associated with pregnancy, birth control pills or menopause Sexual activity Having a sexually transmitted infection  Your treatment plan is Metronidazole  or Flagyl  500mg  twice a day for 7 days.  I have electronically sent this prescription into the pharmacy that you have chosen.  Be sure to take all of the medication as directed. Stop taking any medication if you develop a rash, tongue swelling or shortness of breath. Mothers who are breast feeding should consider pumping and discarding their breast milk while on these antibiotics. However, there is no consensus that infant exposure at these doses would be harmful.  Remember that medication creams can weaken latex  condoms.   HOME CARE:  Good hygiene may prevent some types of vaginosis from recurring and may relieve some symptoms:  Avoid baths, hot tubs and whirlpool spas. Rinse soap from your outer genital area after a shower, and dry the area well to prevent irritation. Don't use scented or harsh soaps, such as those with deodorant or antibacterial action. Avoid irritants. These include scented tampons and pads. Wipe from front to back after using the toilet. Doing so avoids spreading fecal bacteria to your vagina.  Other things that may help prevent vaginosis include:  Don't douche. Your vagina doesn't require cleansing other than normal bathing. Repetitive douching disrupts the normal organisms that reside in the vagina and can actually increase your risk of vaginal infection. Douching won't clear up a vaginal infection. Use a latex condom. Both female and female latex condoms may help you avoid infections spread by sexual contact. Wear cotton underwear. Also wear pantyhose with a cotton crotch. If you feel comfortable without it, skip wearing underwear to bed. Yeast thrives in [employer] Your symptoms should improve in the next day or two.  GET HELP RIGHT AWAY IF:  You have pain in your lower abdomen ( pelvic area or over your ovaries) You develop nausea or vomiting You develop a fever Your discharge changes or worsens You have persistent pain with intercourse You develop shortness of breath, a rapid pulse, or you faint.  These symptoms could be signs of problems or infections that need to be evaluated by a medical provider now.  MAKE SURE YOU   Understand these instructions. Will watch your condition. Will get help right away if you are not doing  well or get worse.  Your e-visit answers were reviewed by a board certified advanced clinical practitioner to complete your personal care plan. Depending upon the condition, your plan could have included both over the counter or  prescription medications. Please review your pharmacy choice to make sure that you have choses a pharmacy that is open for you to pick up any needed prescription, Your safety is important to us . If you have drug allergies check your prescription carefully.   You can use MyChart to ask questions about today's visit, request a non-urgent call back, or ask for a work or school excuse for 24 hours related to this e-Visit. If it has been greater than 24 hours you will need to follow up with your provider, or enter a new e-Visit to address those concerns. You will get a MyChart message within the next two days asking about your experience. I hope that your e-visit has been valuable and will speed your recovery.  I have spent 5 minutes in review of e-visit questionnaire, review and updating patient chart, medical decision making and response to patient.   Helyn Schwan, FNP
